# Patient Record
Sex: Female | Born: 1965 | Race: White | Hispanic: No | Marital: Married | Smoking: Never smoker
Health system: Southern US, Community
[De-identification: ages and names within clinical notes are randomized; demographics above are authoritative.]

## PROBLEM LIST (undated history)

## (undated) DIAGNOSIS — E7800 Pure hypercholesterolemia, unspecified: Secondary | ICD-10-CM

## (undated) HISTORY — PX: PARTIAL HYSTERECTOMY: SHX80

## (undated) HISTORY — DX: Pure hypercholesterolemia, unspecified: E78.00

---

## 1997-10-05 ENCOUNTER — Other Ambulatory Visit: Admission: RE | Admit: 1997-10-05 | Discharge: 1997-10-05 | Payer: Self-pay | Admitting: Obstetrics and Gynecology

## 1998-10-17 ENCOUNTER — Other Ambulatory Visit: Admission: RE | Admit: 1998-10-17 | Discharge: 1998-10-17 | Payer: Self-pay | Admitting: Obstetrics and Gynecology

## 1999-04-04 ENCOUNTER — Inpatient Hospital Stay (HOSPITAL_COMMUNITY): Admission: AD | Admit: 1999-04-04 | Discharge: 1999-04-07 | Payer: Self-pay | Admitting: Obstetrics and Gynecology

## 1999-05-08 ENCOUNTER — Other Ambulatory Visit: Admission: RE | Admit: 1999-05-08 | Discharge: 1999-05-08 | Payer: Self-pay | Admitting: Obstetrics and Gynecology

## 1999-05-16 ENCOUNTER — Encounter: Admission: RE | Admit: 1999-05-16 | Discharge: 1999-08-14 | Payer: Self-pay | Admitting: Obstetrics & Gynecology

## 2000-10-03 ENCOUNTER — Other Ambulatory Visit: Admission: RE | Admit: 2000-10-03 | Discharge: 2000-10-03 | Payer: Self-pay | Admitting: *Deleted

## 2001-01-14 ENCOUNTER — Ambulatory Visit (HOSPITAL_BASED_OUTPATIENT_CLINIC_OR_DEPARTMENT_OTHER): Admission: RE | Admit: 2001-01-14 | Discharge: 2001-01-14 | Payer: Self-pay | Admitting: Orthopedic Surgery

## 2001-10-23 ENCOUNTER — Other Ambulatory Visit: Admission: RE | Admit: 2001-10-23 | Discharge: 2001-10-23 | Payer: Self-pay | Admitting: *Deleted

## 2002-12-15 ENCOUNTER — Other Ambulatory Visit: Admission: RE | Admit: 2002-12-15 | Discharge: 2002-12-15 | Payer: Self-pay | Admitting: *Deleted

## 2004-06-05 ENCOUNTER — Other Ambulatory Visit: Admission: RE | Admit: 2004-06-05 | Discharge: 2004-06-05 | Payer: Self-pay | Admitting: Obstetrics & Gynecology

## 2006-09-19 ENCOUNTER — Other Ambulatory Visit: Admission: RE | Admit: 2006-09-19 | Discharge: 2006-09-19 | Payer: Self-pay | Admitting: Family Medicine

## 2006-09-25 ENCOUNTER — Ambulatory Visit (HOSPITAL_COMMUNITY): Admission: AD | Admit: 2006-09-25 | Discharge: 2006-09-25 | Payer: Self-pay | Admitting: Obstetrics & Gynecology

## 2006-09-25 ENCOUNTER — Encounter (INDEPENDENT_AMBULATORY_CARE_PROVIDER_SITE_OTHER): Payer: Self-pay | Admitting: Obstetrics & Gynecology

## 2007-01-08 ENCOUNTER — Encounter (INDEPENDENT_AMBULATORY_CARE_PROVIDER_SITE_OTHER): Payer: Self-pay | Admitting: *Deleted

## 2007-01-08 ENCOUNTER — Ambulatory Visit (HOSPITAL_COMMUNITY): Admission: RE | Admit: 2007-01-08 | Discharge: 2007-01-09 | Payer: Self-pay | Admitting: *Deleted

## 2007-02-14 ENCOUNTER — Encounter: Admission: RE | Admit: 2007-02-14 | Discharge: 2007-02-14 | Payer: Self-pay | Admitting: *Deleted

## 2010-08-29 NOTE — Op Note (Signed)
NAMEKALLI, GREENFIELD             ACCOUNT NO.:  [id number]   MEDICAL RECORD NO.:  [id number]          PATIENT TYPE:  OIB   LOCATION:  9302                          FACILITY:  [facility]   PHYSICIAN:  Mina B. Earlene Plater, M.D.  DATE OF BIRTH:  1965/09/15   DATE OF PROCEDURE:  01/08/2007  DATE OF DISCHARGE:                               OPERATIVE REPORT   PREOPERATIVE DIAGNOSIS:  Menorrhagia.   POSTOPERATIVE DIAGNOSIS:  Menorrhagia.   PROCEDURE:  Total laparoscopic hysterectomy.   SURGEON:  Chester Holstein. Earlene Plater, M.D.   ASSISTANT:  Lenoard Aden, M.D.   ANESTHESIA:  General.   FINDINGS:  Normal-appearing uterus, tubes and ovaries.   SPECIMEN:  Uterus and cervix to pathology.   BLOOD LOSS:  150 mL.   COMPLICATIONS:  None.   INDICATIONS:  Patient with a history of heavy and prolonged menstrual  bleeding.  Previous treatment by another Haja Crego was hysteroscopy, D&C.  Was told she had intracavitary fibroid which was not resectable.  The  patient was counseled regarding alternatives including attempt at  endometrial ablation with either a ThermaChoice or hydrothermal ablation  versus hysterectomy.  Given the intracavitary fibroid she would not be a  good candidate for IUD.  The patient is not responding  to birth control  pills.  Advised of the risks of surgery, including infection, bleeding,  damage to surrounding organs.   PROCEDURE:  The patient is taken to the operating room and general  anesthesia obtained.  She was prepped and draped in standard fashion.  Foley catheter inserted to the bladder, speculum inserted.  Uterus  sounded to 9 cm.  The #8 Rumi was assembled, inserted and secured in  standard manner.   10 mm incision placed in umbilicus.  Fascia was divided sharply and  elevated with Kocher clamps.  Posterior sheath and peritoneum were  entered sharply.  Pursestring suture of 0 Vicryl placed around the  fascial defect.  Hassan cannula inserted and secured.   Pneumoperitoneum  obtained with CO2 gas, 11 mm trocar placed in left lower quadrant, 5 mm  in the right each under direct laparoscopic visualization.   Trendelenburg position obtained.  Bowel mobilized superiorly.  Course of  each ureter identified, found to be well away.  Left round ligament was  placed on traction, sealed and divided with the gyrus dissecting  forceps.  Tube and utero-ovarian pedicle were similarly sealed and  divided.  The left uterine artery was skeletonized.  Bladder flap  created sharply.  Left uterine artery sealed and divided with the gyrus.  Entire procedure repeated on the right side in the same manner.   Bladder was mobilized caudad and the uterus deviated posteriorly and the  Koh cup elevated, anterior colpotomy made with plasma spatula.  Uterus  was then deviated anteriorly and posterior colpotomy made with spatula.  The angles were taken down with the gyrus bipolar with hemostasis  obtained.   Uterus was delivered into the vagina.  This maintained pneumoperitoneum.  The vagina was then closed with interrupted plain sutures of 0 Vicryl  laparoscopically.  Pelvis was irrigated.  Line of dissection inspected,  found to be hemostatic.  Pneumoperitoneum was taken down to minimum  value to visualize the cuff under low pressure.  There was no sign of  bleeding.   The inferior ports were removed.  Their sites inspected laparoscopically  and found to be hemostatic.  Scope was removed.  Gas released.  Hassan  cannula removed.  Umbilical incision elevated.  Pursestring suture  snugged down.  This obliterated the fascial defect.  No intra-abdominal  contents herniated through prior closure.  Skin was closed in umbilicus  and left lower quadrant and the subcutaneous tissue with 4-0 Vicryl.  Each incision was then closed with Dermabond.   The patient tolerated the procedure well without complications.  She was  taken to recovery room awake, alert in stable  condition.  All counts  correct per the operating room staff.      Gerri Spore B. Earlene Plater, M.D.  Electronically Signed     WBD/MEDQ  D:  01/08/2007  T:  01/09/2007  Job:  [id number]

## 2010-08-29 NOTE — Op Note (Signed)
Jasmin Reese, DAMAS NO.:  [id number]   MEDICAL RECORD NO.:  [id number]          PATIENT TYPE:  AMB   LOCATION:  [facility]C                          [facility]ILITY:  [facility]   PHYSICIAN:  Freddy Finner, M.D.   DATE OF BIRTH:  1965-09-08   DATE OF PROCEDURE:  09/25/2006  DATE OF DISCHARGE:                               OPERATIVE REPORT   PREOPERATIVE DIAGNOSIS:  Uterine hemorrhage.   POSTOPERATIVE DIAGNOSIS:  Large submucosal leiomyomata.   OPERATIVE PROCEDURES:  1. Hysteroscopy.  2. Dilation and curettage.  3. Attempted biopsy of myomas; size of myomas precluded hysteroscopic      resection.   ANESTHESIA:  IV sedation, deep paracervical block, as well as  superficial block of the uterosacral ligaments and anterior cervical  lip.   ESTIMATED BLOOD LOSS:  Just during the procedure -- less than 50 mL.   INTRAOPERATIVE COMPLICATIONS:  None.   HISTORY:  The patient is a 45 year old married white female who has had  a history of dysfunctional bleeding and menorrhagia.  In June of 2007  she was found by ultrasound to have uterine leiomyomata, including one  measuring approximately 2.1 cm which was abutting the endometrial canal  on pelvic ultrasound.  Other myomas were smaller measuring only 8 mm and  6 mm.  The 2.1 cm fibroid appeared to have some degeneration; and her  presentation on that day was for breakthrough bleeding on oral  contraceptives which had adequately controlled her menses up to that  point, or at least in the recent past.  She apparently continued to do  well with the oral contraceptives; but presented, again, on the day of  surgery with a history of heavy bleeding on the day prior to the  procedure; and awakening from sleep in a pool of blood with 4 or more  centimeter large clots past vaginally.  She was seen initially by her  general medical doctor with [facility]; and sent, directly,  on my instructions to the hospital.   DESCRIPTION OF  PROCEDURE:  After arrival there she was brought to the  operating room.  She was placed under adequate IV sedation.  She was  placed in the dorsal lithotomy position using the [employer] system.  A Betadine prep of mons, perineum, and vagina was carried out in the  usual fashion and sterile drapes were applied.  Cervix was visualized  using a bivalve speculum.  The anterior cervical lip was anesthetized  with a 50:50 mixture of 1% Xylocaine and 0.5% Marcaine.  A total of  approximately 20 mL of this mixture was used for the local block.  Additional injections were made at 4 and 8 o'clock in the vaginal  fornices at a depth of approximately 5 mm.   The anterior cervical limb was grasped at the anesthetized point with a  single-tooth tenaculum.  Using a spinal needle and approximately 15 to  16 mL of the above described solution, injections were made at  approximately 9 o'clock by sliding along the cervix and the lateral  uterus to a depth of approximately 3 cm.  Aspiration  was performed  repeatedly as the liquid was injected.  No apparent intravenous  injection was made.  This was given approximately 5 minutes to set up.  The cervix was then progressively dilated with Hanks to approximately  23.  This was accomplished easily without resistance to passage of the  dilator's.  The 12-1/2-degree hysteroscope was then used; using 3%  sorbitol as distending medium.  Two large masses could be identified  essentially filling the cavity.  The largest seemed to arise posteriorly  at approximately the 5 o'clock position; and it was estimated to be  approximately 2.5 to 3 cm in diameter.  Another lesion arose from  approximately 11 o'clock at about at the posterior lesion; it measured  approximately 2 cm.   Thorough curettage with repeated efforts to curette the fibroids was  carried out.  Exploration of the uterine cavity was carried out using  the large ring forceps, using Kocher clamps; and it  was impossible to  retrieve either of these lesions.  Their size precluded complete  resection; and sorbitol limits would preclude the loop excisional  procedure; and for that reason, the procedure was terminated at this  point.  Please note that photographs were made of these lesions; and  will be retained in the office record.   The patient was awakened.  She was taken to recovery in good condition.  She was given Methergine IM in the operating room; and 20 units of  Pitocin were added to her IV solution.  Reassessment approximately 1  hour later revealed the patient to be alert.  She was having minimal  bleeding at that point.  She was having only mild cramping.  She was  given instructions about returning for heavy bleeding, again; and for  followup in approximately 1 week in the office.  She was given two  Methergine (0.2 mg tablets) to be taken one at bedtime following the  procedure, and one the next morning.  She was given a prescription for  Vicodin if she felt the need for something stronger for pain.  She was  instructed to avoid vaginal entry.  She was given other routine  postoperative instructions.  Her condition was considered to be  satisfactory; and she was discharged.      Freddy Finner, M.D.  Electronically Signed     WRN/MEDQ  D:  09/26/2006  T:  09/26/2006  Job:  [id number]

## 2010-09-01 NOTE — H&P (Signed)
[name], Reese             ACCOUNT NO.:  [id number]   MEDICAL RECORD NO.:  [id number]          PATIENT TYPE:  AMB   LOCATION:                                [facility]   PHYSICIAN:  Charles A. Delcambre, MDDATE OF BIRTH:  October 20, 1965   DATE OF ADMISSION:  DATE OF DISCHARGE:                              HISTORY & PHYSICAL   She is a 45 year old gravida 1, para 1.  She has previously seen Dr.  Jennette Kettle at [facility], who tried putting her on Loestrin 1 Fe,  which will be a 1/20 pill.  She took this once a day and did not stop  bleeding.  On September 25, 2006, she had many clots passed and went on that  evening to have hysteroscopy and D&C done.  He had talked about an  ablation at this point with NovaSure.  After seeing findings of the  hysteroscopy and D&C, he changed his plan to hysterectomy.  She now  comes in wanting to discuss options.  Ultrasound on October 04, 2005 showed  2.1, 8 mm, and 6 mm intramural fibroids, and a 2.1 cm fibroid abutting  the endometrial stripe, which has also had degeneration.  Left ovary was  normal.  Right ovary was normal.  Uterus measures 6.8 x 3.6 x 4.1 cm.  She continues to take Loestrin 24 Fe since the [facility] and is not currently  bleeding since last Friday.  She comes in on her own for abnormal  uterine bleeding.   PAST MEDICAL HISTORY:  None.   PAST SURGICAL HISTORY:  1. Hand removal of cyst in 2002.  2. D&C and hysteroscopy, as noted above.   MEDICATIONS:  1. Loestrin 24 Fe.  2. Xanax 0.5 mg to help her sleep.  3. Vitamins daily.   ALLERGIES:  No known drug allergies.   SOCIAL HISTORY:  No tobacco, ethanol, drug use, or STD exposure in the  past.  The patient is married and in a monogamous relationship with her  husband.   FAMILY HISTORY:  Hypertension.  Denies family history of breast, uterus,  ovary, cervix, or colon cancer, lymphoma, coronary artery disease,  stroke, or diabetes.   REVIEW OF SYSTEMS:  Denies fever, chills,  rashes, lesions, headaches,  dizziness.  Some seasonal allergies.  No chest pain, no shortness of  breath.  No diarrhea, constipation, or bleeding.  No melena or  hematochezia.  No urgency, frequency, dysuria, incontinence, or  hematuria.  No galactorrhea.  No emotional changes.   PHYSICAL EXAMINATION:  HEENT:  Grossly within normal limits.  NECK:  Supple, without thyromegaly or adenopathy.  LUNGS:  Clear bilaterally.  BACK:  No CVAT.  BREASTS:  Negative, per prior doctor.  Symmetrical.  ABDOMEN:  Soft, nontender.  No hepatosplenomegaly or other masses noted.  PELVIC:  Normal external female genitalia.  Nonenlarged uterus.  Adnexa  nontender.  Ovaries are palpable and normal size bilaterally.  RECTAL:  Not done.  Anus and perineal body appeared normal.   ASSESSMENT:  1. Menorrhagia, 626.2.  2. Fibroids, 218.9.  3. Submucosal fibroid, 218.0.   PLAN:  We  will plan a sonohystogram.  If this is suitable for a  ThermaChoice ablation, we will proceed with ThermaChoice ablation.  She  gives informed consent for ThermaChoice ablation, understanding there is  an approximately 10% failure rate, eumenorrhea 80%-90%, our goal, 30%-  40% risk of amenorrhea.  All questions were answered, and she accepts  risks of perforation, failed procedure, moving on to a rollerball  ablation procedure if necessary with resection of the fibroids as well.  If this were to fail, she would proceed with some type of hysterectomy.  She was in agreement, but she would like to try this ablation.  All  questions were answered.  She gives informed consent and questions re  answered as noted above.  Preoperative serum HCGs and CBC will be given.  All questions were answered.  She will remain n.p.o. for 8 hours prior  to the surgery, and we will proceed as outlined with ThermaChoice  ablation.      Charles A. Sydnee Cabal, MD  Electronically Signed     CAD/MEDQ  D:  10/17/2006  T:  10/18/2006  Job:  [id number]

## 2011-01-25 LAB — CBC
HCT: 32.8 — ABNORMAL LOW
HCT: 41.1
MCHC: 34.3
MCV: 88.1
Platelets: 438 — ABNORMAL HIGH
RBC: 3.72 — ABNORMAL LOW
RBC: 4.73
WBC: 8

## 2011-01-25 LAB — DIFFERENTIAL
Basophils Absolute: 0
Basophils Relative: 0
Eosinophils Absolute: 0.2
Eosinophils Relative: 2
Lymphocytes Relative: 21
Lymphs Abs: 1.7
Monocytes Absolute: 0.3
Monocytes Relative: 3
Neutro Abs: 5.9
Neutrophils Relative %: 73

## 2011-01-25 LAB — PREGNANCY, URINE: Preg Test, Ur: NEGATIVE

## 2011-02-01 LAB — URINE MICROSCOPIC-ADD ON

## 2011-02-01 LAB — URINALYSIS, ROUTINE W REFLEX MICROSCOPIC
Bilirubin Urine: NEGATIVE
Protein, ur: >300 — AB

## 2011-02-01 LAB — CBC
Hemoglobin: 12.7
MCHC: 33.8
RDW: 13.3

## 2014-11-22 ENCOUNTER — Other Ambulatory Visit: Payer: Self-pay | Admitting: Obstetrics & Gynecology

## 2014-11-22 DIAGNOSIS — R928 Other abnormal and inconclusive findings on diagnostic imaging of breast: Secondary | ICD-10-CM

## 2014-12-08 ENCOUNTER — Other Ambulatory Visit: Payer: Self-pay

## 2019-02-03 ENCOUNTER — Other Ambulatory Visit: Payer: Self-pay

## 2019-02-03 ENCOUNTER — Ambulatory Visit
Admission: RE | Admit: 2019-02-03 | Discharge: 2019-02-03 | Disposition: A | Discharge status: Home / Self Care | Payer: 59 | Source: Ambulatory Visit | Attending: Obstetrics & Gynecology | Admitting: Obstetrics & Gynecology

## 2019-02-03 ENCOUNTER — Other Ambulatory Visit: Payer: Self-pay | Admitting: Obstetrics & Gynecology

## 2019-02-03 DIAGNOSIS — Z1231 Encounter for screening mammogram for malignant neoplasm of breast: Secondary | ICD-10-CM

## 2019-07-25 ENCOUNTER — Other Ambulatory Visit: Payer: Self-pay

## 2019-07-25 ENCOUNTER — Encounter: Payer: Self-pay | Admitting: Emergency Medicine

## 2019-07-25 ENCOUNTER — Ambulatory Visit
Admission: EM | Admit: 2019-07-25 | Discharge: 2019-07-25 | Disposition: A | Discharge status: Home / Self Care | Payer: 59 | Attending: Emergency Medicine | Admitting: Emergency Medicine

## 2019-07-25 DIAGNOSIS — B379 Candidiasis, unspecified: Secondary | ICD-10-CM

## 2019-07-25 DIAGNOSIS — R300 Dysuria: Secondary | ICD-10-CM | POA: Diagnosis present

## 2019-07-25 DIAGNOSIS — R350 Frequency of micturition: Secondary | ICD-10-CM | POA: Diagnosis present

## 2019-07-25 LAB — POCT URINALYSIS DIP (MANUAL ENTRY)
Bilirubin, UA: NEGATIVE
Glucose, UA: NEGATIVE mg/dL
Ketones, POC UA: NEGATIVE mg/dL
Nitrite, UA: NEGATIVE
Protein Ur, POC: NEGATIVE mg/dL
Spec Grav, UA: 1.02 (ref 1.010–1.025)
Urobilinogen, UA: 0.2 E.U./dL
pH, UA: 5.5 (ref 5.0–8.0)

## 2019-07-25 MED ORDER — PHENAZOPYRIDINE HCL 100 MG PO TABS: 100.0000 mg | ORAL_TABLET | Freq: Three times a day (TID) | ORAL | 0 refills | Status: DC | PRN

## 2019-07-25 MED ORDER — FLUCONAZOLE 150 MG PO TABS: ORAL_TABLET | ORAL | 0 refills | Status: DC

## 2019-07-25 NOTE — ED Provider Notes (Addendum)
[facility]   CC: Burning with urination  SUBJECTIVE:  Jasmin Reese is a 54 y.o. female who presented to the urgent care for complaint of dysuria, pelvic pressure and feeling like not emptying her bladder for the past 1 day.  Patient denies a precipitating event, recent sexual encounter, excessive caffeine intake.  Denies flank or abdominal pain.  Has not tried any OTC medication.  Symptoms are made worse with urination.  Report frequent urination and urgency.  Admits to similar symptoms in the past.  Denies fever, chills, nausea, vomiting, abdominal pain, flank pain, abnormal vaginal discharge or bleeding, hematuria.    She also states she would like to get Diflucan prescribed for possible yeast infection.  LMP: No LMP recorded. Patient is postmenopausal.  ROS: As in HPI.  All other pertinent ROS negative.     History reviewed. No pertinent past medical history. History reviewed. No pertinent surgical history. No Known Allergies No current facility-administered medications on file prior to encounter.   No current outpatient medications on file prior to encounter.   Social History   Socioeconomic History  . Marital status: Married    Spouse name: Not on file  . Number of children: Not on file  . Years of education: Not on file  . Highest education level: Not on file  Occupational History  . Not on file  Tobacco Use  . Smoking status: Never Smoker  . Smokeless tobacco: Never Used  Substance and Sexual Activity  . Alcohol use: Yes    Comment: occasionally  . Drug use: Never  . Sexual activity: Yes  Other Topics Concern  . Not on file  Social History Narrative  . Not on file   Social Determinants of Health   Financial Resource Strain:   . Difficulty of Paying Living Expenses:   Food Insecurity:   . Worried About Programme researcher, broadcasting/film/video in the Last Year:   . Barista in the Last Year:   Transportation Needs:   . Freight forwarder  (Medical):   Marland Kitchen Lack of Transportation (Non-Medical):   Physical Activity:   . Days of Exercise per Week:   . Minutes of Exercise per Session:   Stress:   . Feeling of Stress :   Social Connections:   . Frequency of Communication with Friends and Family:   . Frequency of Social Gatherings with Friends and Family:   . Attends Religious Services:   . Active Member of Clubs or Organizations:   . Attends Banker Meetings:   Marland Kitchen Marital Status:   Intimate Partner Violence:   . Fear of Current or Ex-Partner:   . Emotionally Abused:   Marland Kitchen Physically Abused:   . Sexually Abused:    Family History  Problem Relation Age of Onset  . Hypertension Mother     OBJECTIVE:  There were no vitals filed for this visit. General appearance: AOx3 in no acute distress HEENT: NCAT.  Oropharynx clear.  Lungs: clear to auscultation bilaterally without adventitious breath sounds Heart: regular rate and rhythm.  Radial pulses 2+ symmetrical bilaterally Abdomen: soft; non-distended; no tenderness; bowel sounds present; no guarding or rebound tenderness Back: no CVA tenderness Extremities: no edema; symmetrical with no gross deformities Skin: warm and dry Neurologic: Ambulates from chair to exam table without difficulty Psychological: alert and cooperative; normal mood and affect  Labs Reviewed  POCT URINALYSIS DIP (MANUAL ENTRY) - Abnormal; Notable for the following components:      Result  Value   Blood, UA small (*)    Leukocytes, UA Trace (*)    All other components within normal limits  URINE CULTURE    ASSESSMENT & PLAN:  1. Frequent urination   2. Dysuria    POCT urine analysis showed trace of blood with trace of leukocyte, inconclusive for UTI.  Sample will be culture.  Patient will call if results are abnormal. Pyridium will be prescribed for symptomatic relief. Diflucan was refilled for possible yeast infection  Meds ordered this encounter  Medications  . phenazopyridine  (PYRIDIUM) 100 MG tablet    Sig: Take 1 tablet (100 mg total) by mouth 3 (three) times daily as needed for pain.    Dispense:  20 tablet    Refill:  0  . fluconazole (DIFLUCAN) 150 MG tablet    Sig: Take 1 tablet ( 150 mg ) by mouth daily.  May take second dose 3 days after the first dose if symptom does not resolve.    Dispense:  3 tablet    Refill:  0   Discharge instruction Urine culture sent.  We will call you with the results.   Push fluids and get plenty of rest.   Take medication as directed and to completion Take pyridium as prescribed and as needed for symptomatic relief Follow up with PCP if symptoms persists Return here or go to ER if you have any new or worsening symptoms such as fever, worsening abdominal pain, nausea/vomiting, flank pain, etc...  Outlined signs and symptoms indicating need for more acute intervention. Patient verbalized understanding. After Visit Summary given.     Emerson Monte, FNP 07/25/19 1124    Emerson Monte, FNP 07/25/19 1125

## 2019-07-25 NOTE — Discharge Instructions (Addendum)
Urine culture sent.  We will call you with the results.   Push fluids and get plenty of rest.   Take medication as directed and to completion Take pyridium as prescribed and as needed for symptomatic relief Follow up with PCP if symptoms persists Return here or go to ER if you have any new or worsening symptoms such as fever, worsening abdominal pain, nausea/vomiting, flank pain, etc...  Outlined signs and symptoms indicating need for more acute intervention. Patient verbalized understanding. After Visit Summary given.

## 2019-07-25 NOTE — ED Triage Notes (Signed)
Last night started feeling like she was not emptying her bladder completely.  Has some lower back pressure and pelvic pressure. Urinated small amount this am, feels like she should have had more urine this am.

## 2019-07-27 LAB — URINE CULTURE: Culture: NO GROWTH

## 2020-09-21 IMAGING — MG DIGITAL SCREENING BILAT W/ TOMO W/ CAD
8 series · 8 of 24 positions shown · non-contrast
Comparison: Previous exam(s).

CLINICAL DATA: Screening.

EXAM:
DIGITAL SCREENING BILATERAL MAMMOGRAM WITH TOMO AND CAD

[R MLO synth-2D]
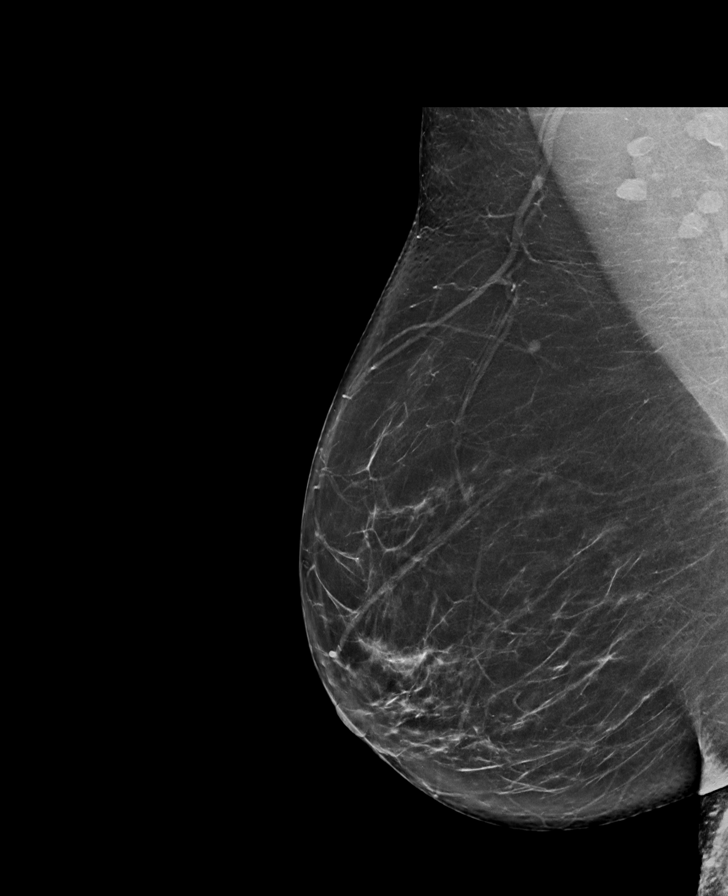

[L CC synth-2D]
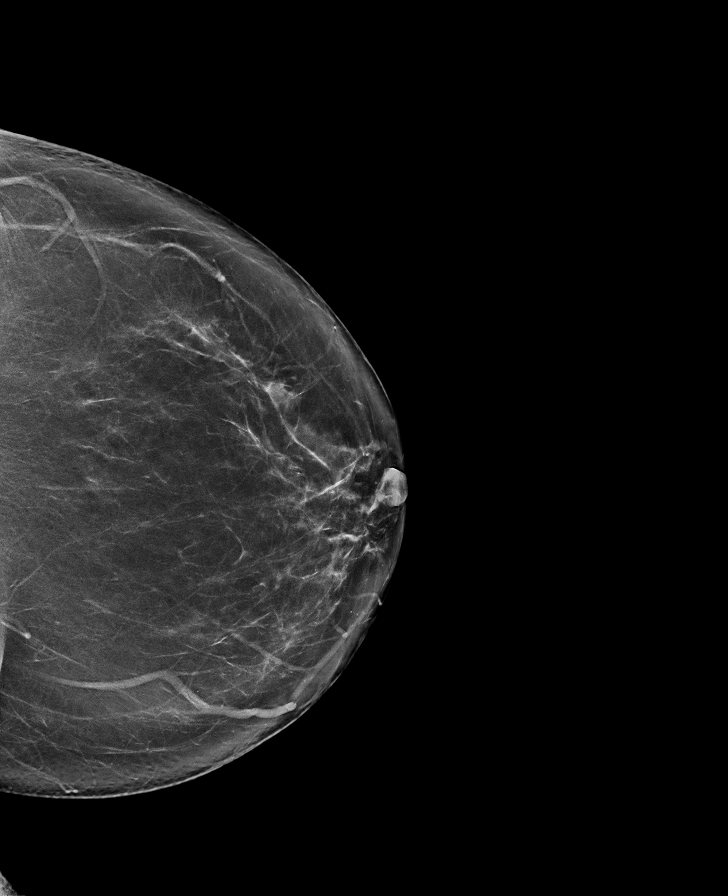

[R CC synth-2D]
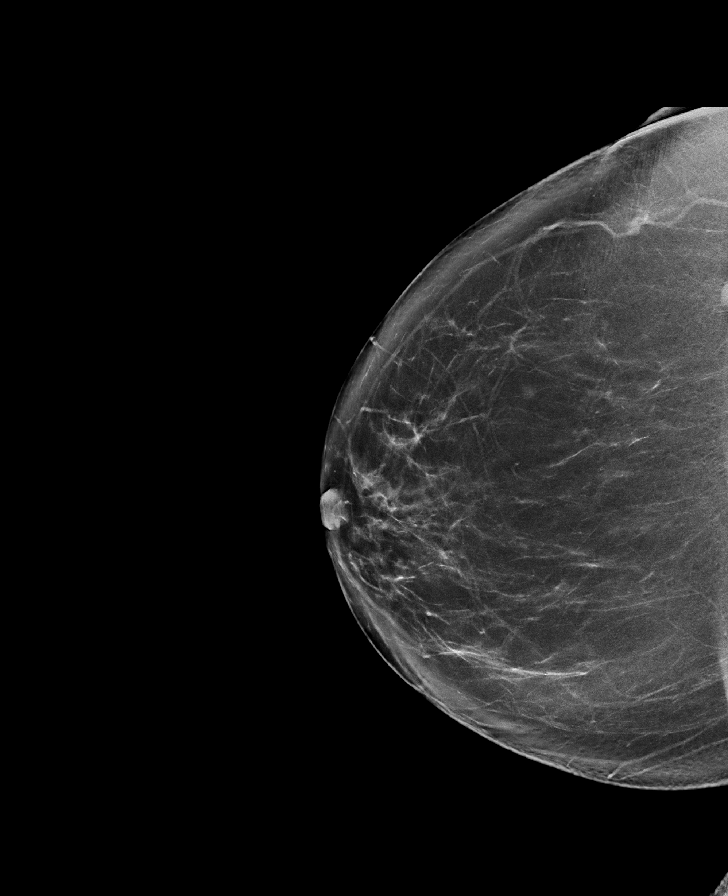

[L MLO synth-2D]
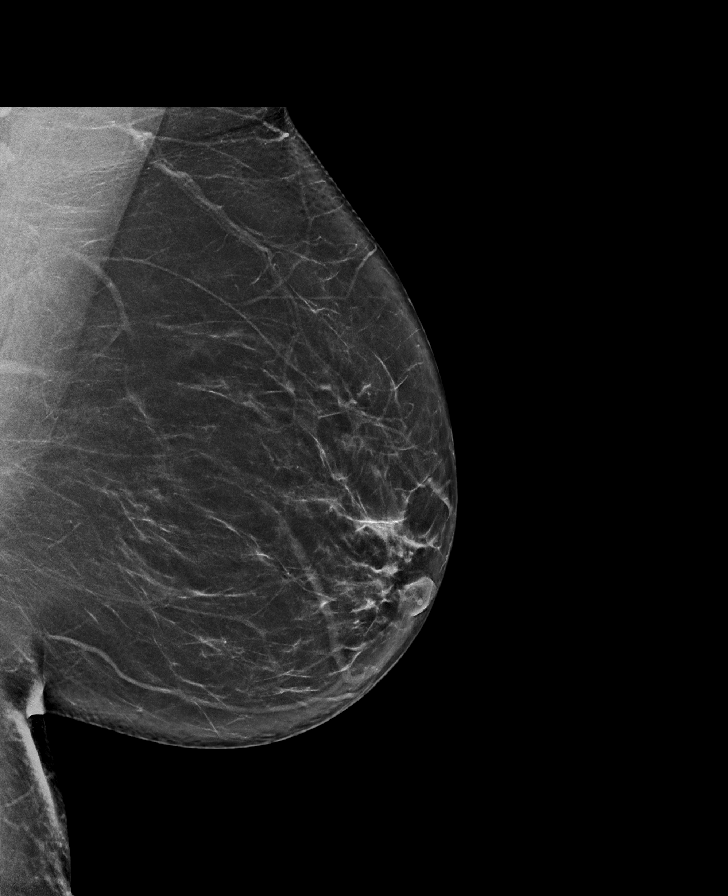

[R MLO tomo · tomo slice 43/85.0]
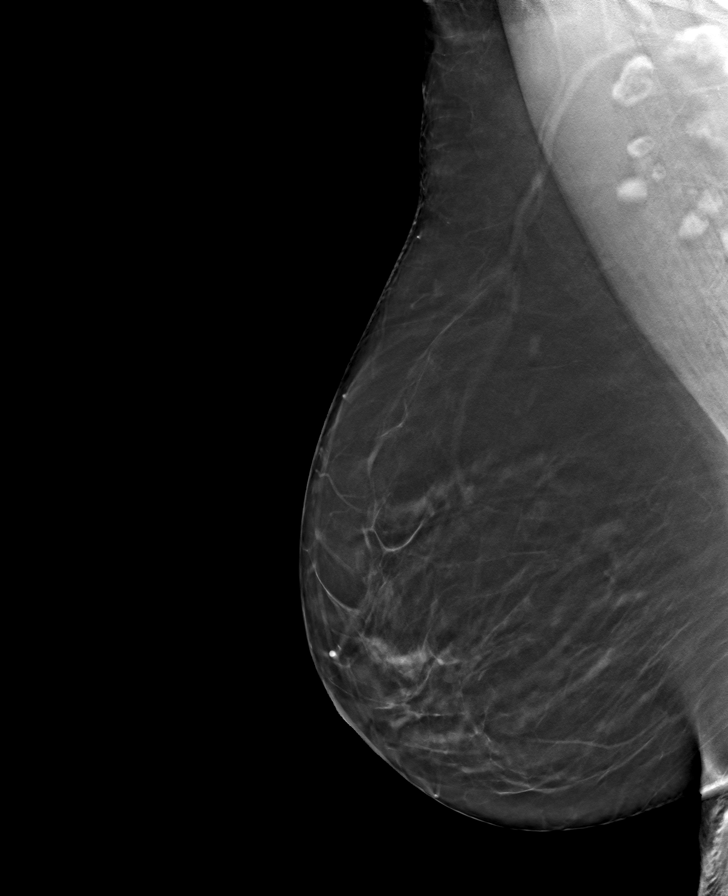

[L MLO tomo · tomo slice 40/79.0]
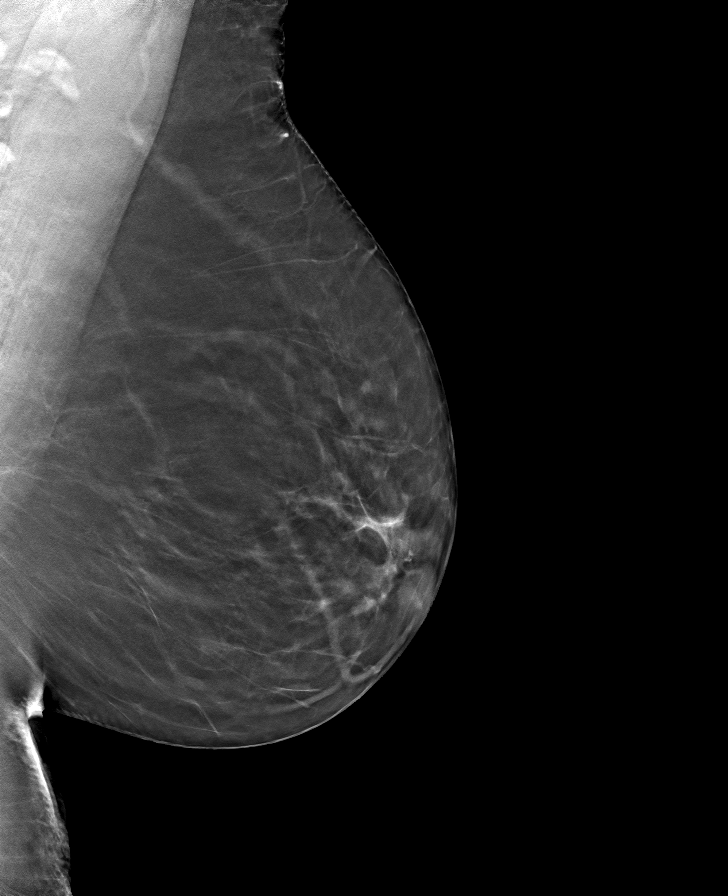

[R CC tomo · tomo slice 43/86.0]
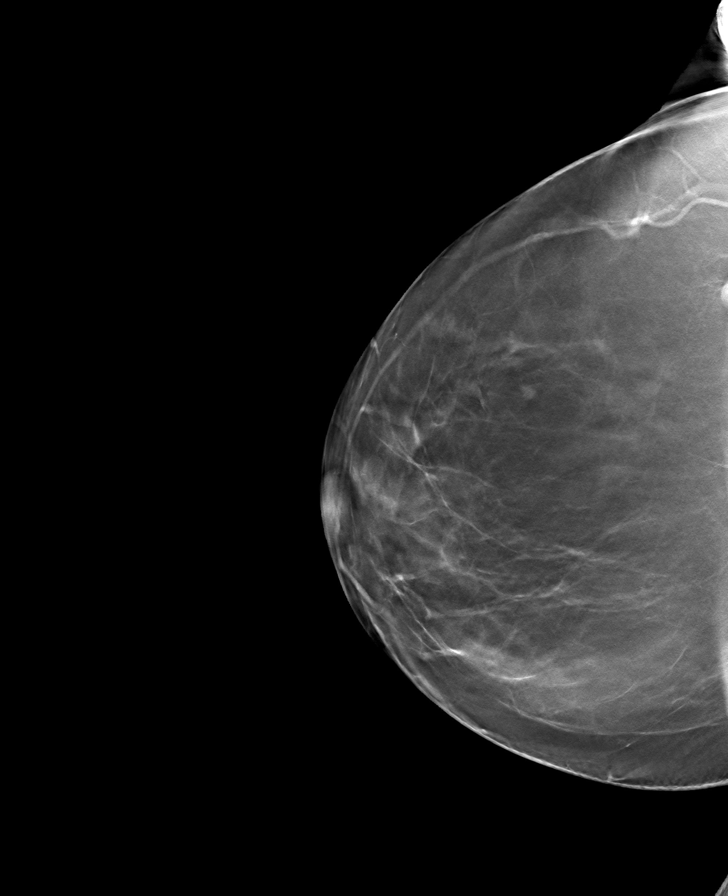

[L CC tomo · tomo slice 41/81.0]
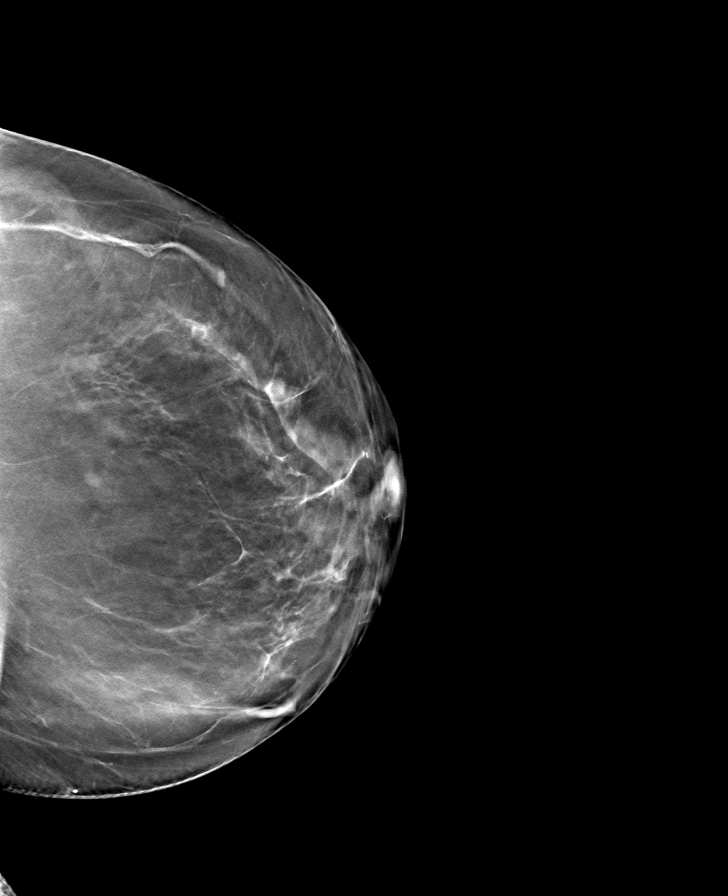

[8 of 24 positions shown; findings below may reference images not displayed]

ACR Breast Density Category b: There are scattered areas of
fibroglandular density.
FINDINGS: There are no findings suspicious for malignancy. Images were
processed with CAD.
IMPRESSION: No mammographic evidence of malignancy. A result letter of this
screening mammogram will be mailed directly to the patient.

RECOMMENDATION:
Screening mammogram in one year. (Code:CN-U-775)

BI-RADS CATEGORY  1: Negative.

## 2021-02-10 ENCOUNTER — Encounter: Payer: Self-pay | Admitting: Internal Medicine

## 2021-03-07 ENCOUNTER — Encounter: Payer: Self-pay | Admitting: Internal Medicine

## 2021-03-07 ENCOUNTER — Ambulatory Visit (INDEPENDENT_AMBULATORY_CARE_PROVIDER_SITE_OTHER): Payer: 59 | Admitting: Internal Medicine

## 2021-03-07 ENCOUNTER — Other Ambulatory Visit (INDEPENDENT_AMBULATORY_CARE_PROVIDER_SITE_OTHER): Payer: 59

## 2021-03-07 VITALS — BP 110/80 | HR 64 | Ht 69.0 in | Wt 216.0 lb

## 2021-03-07 DIAGNOSIS — Z1212 Encounter for screening for malignant neoplasm of rectum: Secondary | ICD-10-CM | POA: Diagnosis not present

## 2021-03-07 DIAGNOSIS — R1310 Dysphagia, unspecified: Secondary | ICD-10-CM | POA: Diagnosis not present

## 2021-03-07 DIAGNOSIS — Z1211 Encounter for screening for malignant neoplasm of colon: Secondary | ICD-10-CM | POA: Diagnosis not present

## 2021-03-07 LAB — CBC WITH DIFFERENTIAL/PLATELET
Basophils Absolute: 0.1 10*3/uL (ref 0.0–0.1)
Basophils Relative: 1 % (ref 0.0–3.0)
Eosinophils Absolute: 0.2 10*3/uL (ref 0.0–0.7)
Eosinophils Relative: 2.9 % (ref 0.0–5.0)
HCT: 46.4 % — ABNORMAL HIGH (ref 36.0–46.0)
Hemoglobin: 15.5 g/dL — ABNORMAL HIGH (ref 12.0–15.0)
Lymphocytes Relative: 28.3 % (ref 12.0–46.0)
Lymphs Abs: 1.7 10*3/uL (ref 0.7–4.0)
MCHC: 33.4 g/dL (ref 30.0–36.0)
MCV: 89.4 fl (ref 78.0–100.0)
Monocytes Absolute: 0.4 10*3/uL (ref 0.1–1.0)
Monocytes Relative: 6.3 % (ref 3.0–12.0)
Neutro Abs: 3.6 10*3/uL (ref 1.4–7.7)
Neutrophils Relative %: 61.5 % (ref 43.0–77.0)
Platelets: 361 10*3/uL (ref 150.0–400.0)
RBC: 5.19 Mil/uL — ABNORMAL HIGH (ref 3.87–5.11)
RDW: 13.2 % (ref 11.5–15.5)
WBC: 5.9 10*3/uL (ref 4.0–10.5)

## 2021-03-07 LAB — COMPREHENSIVE METABOLIC PANEL
ALT: 25 U/L (ref 0–35)
AST: 20 U/L (ref 0–37)
Albumin: 4.4 g/dL (ref 3.5–5.2)
Alkaline Phosphatase: 65 U/L (ref 39–117)
BUN: 9 mg/dL (ref 6–23)
CO2: 28 mEq/L (ref 19–32)
Calcium: 9.3 mg/dL (ref 8.4–10.5)
Chloride: 101 mEq/L (ref 96–112)
Creatinine, Ser: 0.87 mg/dL (ref 0.40–1.20)
GFR: 75.58 mL/min (ref >60.00)
Glucose, Bld: 86 mg/dL (ref 70–99)
Potassium: 4.4 mEq/L (ref 3.5–5.1)
Sodium: 137 mEq/L (ref 135–145)
Total Bilirubin: 0.5 mg/dL (ref 0.2–1.2)
Total Protein: 7.7 g/dL (ref 6.0–8.3)

## 2021-03-07 NOTE — Progress Notes (Signed)
Chief Complaint: Dysphagia, colon cancer screening  HPI : 55 year old female with history of HLD presents with dysphagia and colon cancer screening  Endorses dysphagia that started when she was living in [place] 3-5 years ago. She moved to [place] 3 years ago. Twice a year on average the food gets stuck in the bottom of her chest. One time she was eating sushi and another time she was eating hot pot. The dysphagia occurs frequently with rice. When the dysphagia episode occurs, she would have to wait for the food to eventually to go down. Only has had dysphagia to solids, not liquids. The dysphagia has been stable in severity. Denies N&V. Denies chest burning or regurgitation. Denies fam hx of GI issues. She has gained about 20 lbs since menopause kicked in. Denies abdominal pain, diarrhea, constipation, melena, hematochezia. She had an EGD done in [place] about 2012 that showed small ulcers, but she cannot recall who performed that procedure. She was told to take Prilosec after that procedure but never followed through because she attributed her ulcers to stress. Patient has never had a colonoscopy in the past. She has been told to turn in stool cards but has always forgotten to return them. She has had a partial hysterectomy in the past.   Past Medical History:  Diagnosis Date   High cholesterol      Past Surgical History:  Procedure Laterality Date   PARTIAL HYSTERECTOMY     Family History  Problem Relation Age of Onset   Hypertension Mother    Colon cancer Neg Hx    Stomach cancer Neg Hx    Esophageal cancer Neg Hx    Social History   Tobacco Use   Smoking status: Never   Smokeless tobacco: Never  Vaping Use   Vaping Use: Never used  Substance Use Topics   Alcohol use: Yes    Comment: occasionally   Drug use: Never   Current Outpatient Medications  Medication Sig Dispense Refill   fluconazole (DIFLUCAN) 150 MG tablet Take 1 tablet ( 150 mg ) by mouth daily.  May take second  dose 3 days after the first dose if symptom does not resolve. 3 tablet 0   No current facility-administered medications for this visit.   No Known Allergies   Review of Systems: All systems reviewed and negative except where noted in HPI.   Physical Exam: BP 110/80   Pulse 64   Ht 5\' 9"  (1.753 m)   Wt 216 lb (98 kg)   BMI 31.90 kg/m  Constitutional: Pleasant,well-developed, female in no acute distress. HEENT: Normocephalic and atraumatic. Conjunctivae are normal. No scleral icterus. Cardiovascular: Normal rate, regular rhythm.  Pulmonary/chest: Effort normal and breath sounds normal. No wheezing, rales or rhonchi. Abdominal: Soft, nondistended, nontender. Bowel sounds active throughout. There are no masses palpable. No hepatomegaly. Extremities: No edema Neurological: Alert and oriented to person place and time. Skin: Skin is warm and dry. No rashes noted. Psychiatric: Normal mood and affect. Behavior is normal.  Last set of labs in our system is from 2008  ASSESSMENT AND PLAN: Dysphagia Colon cancer screening Patient presents with intermittent dysphagia to solids, suggestive of an anatomic cause. We will plan to start off with an EGD for further evaluation. Patient is also due for colon cancer screening so will plan to add on colonoscopy at the same time. Since patient has not had labs done in our system since 2008, we will plan to get some basic labs today. -  Check CBC and CMP. Both were unremarkable. - Check EGD/colonoscopy [facility]  Eulah Pont, MD

## 2021-03-07 NOTE — Patient Instructions (Signed)
If you are age 54 or older, your body mass index should be between 23-30. Your Body mass index is 31.9 kg/m. If this is out of the aforementioned range listed, please consider follow up with your Primary Care Provider.  If you are age 73 or younger, your body mass index should be between 19-25. Your Body mass index is 31.9 kg/m. If this is out of the aformentioned range listed, please consider follow up with your Primary Care Provider.   Your provider has requested that you go to the basement level for lab work before leaving today. Press "B" on the elevator. The lab is located at the first door on the left as you exit the elevator.  You have been scheduled for an endoscopy and colonoscopy. Please follow the written instructions given to you at your visit today. Please pick up your prep supplies at the pharmacy within the next 1-3 days. If you use inhalers (even only as needed), please bring them with you on the day of your procedure.    The [facility] providers would like to encourage you to use [facility] to communicate with providers for non-urgent requests or questions.  Due to long hold times on the telephone, sending your provider a message by [facility] may be a faster and more efficient way to get a response.  Please allow 48 business hours for a response.  Please remember that this is for non-urgent requests.   Due to recent changes in healthcare laws, you may see the results of your imaging and laboratory studies on MyChart before your provider has had a chance to review them.  We understand that in some cases there may be results that are confusing or concerning to you. Not all laboratory results come back in the same time frame and the provider may be waiting for multiple results in order to interpret others.  Please give [place] 48 hours in order for your provider to thoroughly review all the results before contacting the office for clarification of your results.   It was a pleasure to see you  today!  Thank you for trusting me with your gastrointestinal care!    Norwood Levo, MD

## 2021-04-14 ENCOUNTER — Encounter: Payer: 59 | Admitting: Internal Medicine

## 2021-05-18 DIAGNOSIS — R5382 Chronic fatigue, unspecified: Secondary | ICD-10-CM | POA: Diagnosis not present

## 2021-05-18 DIAGNOSIS — N951 Menopausal and female climacteric states: Secondary | ICD-10-CM | POA: Diagnosis not present

## 2021-05-25 DIAGNOSIS — R6882 Decreased libido: Secondary | ICD-10-CM | POA: Diagnosis not present

## 2021-05-25 DIAGNOSIS — Z6832 Body mass index (BMI) 32.0-32.9, adult: Secondary | ICD-10-CM | POA: Diagnosis not present

## 2021-05-25 DIAGNOSIS — N951 Menopausal and female climacteric states: Secondary | ICD-10-CM | POA: Diagnosis not present

## 2021-05-25 DIAGNOSIS — R232 Flushing: Secondary | ICD-10-CM | POA: Diagnosis not present

## 2021-05-25 DIAGNOSIS — B351 Tinea unguium: Secondary | ICD-10-CM | POA: Diagnosis not present

## 2021-11-29 DIAGNOSIS — N951 Menopausal and female climacteric states: Secondary | ICD-10-CM | POA: Diagnosis not present

## 2021-11-29 DIAGNOSIS — R5382 Chronic fatigue, unspecified: Secondary | ICD-10-CM | POA: Diagnosis not present

## 2021-12-01 DIAGNOSIS — R6882 Decreased libido: Secondary | ICD-10-CM | POA: Diagnosis not present

## 2021-12-01 DIAGNOSIS — Z6831 Body mass index (BMI) 31.0-31.9, adult: Secondary | ICD-10-CM | POA: Diagnosis not present

## 2021-12-01 DIAGNOSIS — G4700 Insomnia, unspecified: Secondary | ICD-10-CM | POA: Diagnosis not present

## 2021-12-01 DIAGNOSIS — N951 Menopausal and female climacteric states: Secondary | ICD-10-CM | POA: Diagnosis not present

## 2022-01-31 DIAGNOSIS — Z6832 Body mass index (BMI) 32.0-32.9, adult: Secondary | ICD-10-CM | POA: Diagnosis not present

## 2022-01-31 DIAGNOSIS — M8580 Other specified disorders of bone density and structure, unspecified site: Secondary | ICD-10-CM | POA: Insufficient documentation

## 2022-01-31 DIAGNOSIS — Z01419 Encounter for gynecological examination (general) (routine) without abnormal findings: Secondary | ICD-10-CM | POA: Diagnosis not present

## 2022-01-31 DIAGNOSIS — Z1231 Encounter for screening mammogram for malignant neoplasm of breast: Secondary | ICD-10-CM | POA: Diagnosis not present

## 2022-02-05 ENCOUNTER — Other Ambulatory Visit: Payer: Self-pay | Admitting: Obstetrics and Gynecology

## 2022-02-05 DIAGNOSIS — R928 Other abnormal and inconclusive findings on diagnostic imaging of breast: Secondary | ICD-10-CM

## 2022-02-13 ENCOUNTER — Other Ambulatory Visit: Payer: 59

## 2022-02-21 ENCOUNTER — Ambulatory Visit
Admission: RE | Admit: 2022-02-21 | Discharge: 2022-02-21 | Disposition: A | Discharge status: Home / Self Care | Payer: BC Managed Care – PPO | Source: Ambulatory Visit | Attending: Obstetrics and Gynecology | Admitting: Obstetrics and Gynecology

## 2022-02-21 ENCOUNTER — Ambulatory Visit
Admission: RE | Admit: 2022-02-21 | Discharge: 2022-02-21 | Disposition: A | Discharge status: Home / Self Care | Payer: 59 | Source: Ambulatory Visit | Attending: Obstetrics and Gynecology | Admitting: Obstetrics and Gynecology

## 2022-02-21 ENCOUNTER — Other Ambulatory Visit: Payer: Self-pay | Admitting: Obstetrics and Gynecology

## 2022-02-21 DIAGNOSIS — R928 Other abnormal and inconclusive findings on diagnostic imaging of breast: Secondary | ICD-10-CM

## 2022-02-21 DIAGNOSIS — N6314 Unspecified lump in the right breast, lower inner quadrant: Secondary | ICD-10-CM | POA: Diagnosis not present

## 2022-02-21 DIAGNOSIS — N6312 Unspecified lump in the right breast, upper inner quadrant: Secondary | ICD-10-CM | POA: Diagnosis not present

## 2022-02-21 DIAGNOSIS — R92321 Mammographic fibroglandular density, right breast: Secondary | ICD-10-CM | POA: Diagnosis not present

## 2022-05-03 DIAGNOSIS — L723 Sebaceous cyst: Secondary | ICD-10-CM | POA: Diagnosis not present

## 2022-08-06 DIAGNOSIS — Z23 Encounter for immunization: Secondary | ICD-10-CM | POA: Diagnosis not present

## 2022-08-06 DIAGNOSIS — L259 Unspecified contact dermatitis, unspecified cause: Secondary | ICD-10-CM | POA: Diagnosis not present

## 2022-08-23 ENCOUNTER — Ambulatory Visit
Admission: RE | Admit: 2022-08-23 | Discharge: 2022-08-23 | Disposition: A | Discharge status: Home / Self Care | Payer: BC Managed Care – PPO | Source: Ambulatory Visit | Attending: Obstetrics and Gynecology | Admitting: Obstetrics and Gynecology

## 2022-08-23 DIAGNOSIS — N63 Unspecified lump in unspecified breast: Secondary | ICD-10-CM | POA: Diagnosis not present

## 2022-08-23 DIAGNOSIS — R928 Other abnormal and inconclusive findings on diagnostic imaging of breast: Secondary | ICD-10-CM

## 2022-08-23 DIAGNOSIS — N6315 Unspecified lump in the right breast, overlapping quadrants: Secondary | ICD-10-CM | POA: Diagnosis not present

## 2022-09-12 ENCOUNTER — Other Ambulatory Visit: Payer: Self-pay | Admitting: Obstetrics and Gynecology

## 2022-09-12 DIAGNOSIS — N6310 Unspecified lump in the right breast, unspecified quadrant: Secondary | ICD-10-CM

## 2022-10-31 DIAGNOSIS — R319 Hematuria, unspecified: Secondary | ICD-10-CM | POA: Diagnosis not present

## 2022-10-31 DIAGNOSIS — R932 Abnormal findings on diagnostic imaging of liver and biliary tract: Secondary | ICD-10-CM | POA: Diagnosis not present

## 2022-10-31 DIAGNOSIS — M25551 Pain in right hip: Secondary | ICD-10-CM | POA: Diagnosis not present

## 2022-10-31 DIAGNOSIS — N390 Urinary tract infection, site not specified: Secondary | ICD-10-CM | POA: Diagnosis not present

## 2022-10-31 DIAGNOSIS — K760 Fatty (change of) liver, not elsewhere classified: Secondary | ICD-10-CM | POA: Diagnosis not present

## 2022-10-31 DIAGNOSIS — M5431 Sciatica, right side: Secondary | ICD-10-CM | POA: Diagnosis not present

## 2022-10-31 DIAGNOSIS — I700 Atherosclerosis of aorta: Secondary | ICD-10-CM | POA: Diagnosis not present

## 2022-11-06 DIAGNOSIS — M5431 Sciatica, right side: Secondary | ICD-10-CM | POA: Diagnosis not present

## 2022-11-07 DIAGNOSIS — M47816 Spondylosis without myelopathy or radiculopathy, lumbar region: Secondary | ICD-10-CM | POA: Diagnosis not present

## 2022-11-07 DIAGNOSIS — M5136 Other intervertebral disc degeneration, lumbar region: Secondary | ICD-10-CM | POA: Diagnosis not present

## 2022-11-07 DIAGNOSIS — M4727 Other spondylosis with radiculopathy, lumbosacral region: Secondary | ICD-10-CM | POA: Diagnosis not present

## 2022-11-07 DIAGNOSIS — M5186 Other intervertebral disc disorders, lumbar region: Secondary | ICD-10-CM | POA: Diagnosis not present

## 2022-11-07 DIAGNOSIS — M48061 Spinal stenosis, lumbar region without neurogenic claudication: Secondary | ICD-10-CM | POA: Diagnosis not present

## 2022-11-07 DIAGNOSIS — M5116 Intervertebral disc disorders with radiculopathy, lumbar region: Secondary | ICD-10-CM | POA: Diagnosis not present

## 2022-11-07 DIAGNOSIS — M4726 Other spondylosis with radiculopathy, lumbar region: Secondary | ICD-10-CM | POA: Diagnosis not present

## 2022-11-20 DIAGNOSIS — M48061 Spinal stenosis, lumbar region without neurogenic claudication: Secondary | ICD-10-CM | POA: Diagnosis not present

## 2022-11-20 DIAGNOSIS — M5136 Other intervertebral disc degeneration, lumbar region: Secondary | ICD-10-CM | POA: Diagnosis not present

## 2022-11-21 DIAGNOSIS — M6281 Muscle weakness (generalized): Secondary | ICD-10-CM | POA: Diagnosis not present

## 2022-11-21 DIAGNOSIS — M5136 Other intervertebral disc degeneration, lumbar region: Secondary | ICD-10-CM | POA: Diagnosis not present

## 2022-11-21 DIAGNOSIS — M48061 Spinal stenosis, lumbar region without neurogenic claudication: Secondary | ICD-10-CM | POA: Diagnosis not present

## 2022-11-21 DIAGNOSIS — M5386 Other specified dorsopathies, lumbar region: Secondary | ICD-10-CM | POA: Diagnosis not present

## 2022-11-26 DIAGNOSIS — M6281 Muscle weakness (generalized): Secondary | ICD-10-CM | POA: Diagnosis not present

## 2022-11-26 DIAGNOSIS — M5386 Other specified dorsopathies, lumbar region: Secondary | ICD-10-CM | POA: Diagnosis not present

## 2022-11-26 DIAGNOSIS — M48061 Spinal stenosis, lumbar region without neurogenic claudication: Secondary | ICD-10-CM | POA: Diagnosis not present

## 2022-11-26 DIAGNOSIS — M5136 Other intervertebral disc degeneration, lumbar region: Secondary | ICD-10-CM | POA: Diagnosis not present

## 2022-11-27 DIAGNOSIS — D120 Benign neoplasm of cecum: Secondary | ICD-10-CM | POA: Diagnosis not present

## 2022-11-27 DIAGNOSIS — K648 Other hemorrhoids: Secondary | ICD-10-CM | POA: Diagnosis not present

## 2022-11-27 DIAGNOSIS — D125 Benign neoplasm of sigmoid colon: Secondary | ICD-10-CM | POA: Diagnosis not present

## 2022-11-27 DIAGNOSIS — Z1211 Encounter for screening for malignant neoplasm of colon: Secondary | ICD-10-CM | POA: Diagnosis not present

## 2022-11-27 DIAGNOSIS — K2930 Chronic superficial gastritis without bleeding: Secondary | ICD-10-CM | POA: Diagnosis not present

## 2022-11-27 DIAGNOSIS — R1310 Dysphagia, unspecified: Secondary | ICD-10-CM | POA: Diagnosis not present

## 2022-11-27 DIAGNOSIS — K3189 Other diseases of stomach and duodenum: Secondary | ICD-10-CM | POA: Diagnosis not present

## 2022-11-27 DIAGNOSIS — K222 Esophageal obstruction: Secondary | ICD-10-CM | POA: Diagnosis not present

## 2022-11-27 DIAGNOSIS — K2289 Other specified disease of esophagus: Secondary | ICD-10-CM | POA: Diagnosis not present

## 2022-11-27 DIAGNOSIS — K5730 Diverticulosis of large intestine without perforation or abscess without bleeding: Secondary | ICD-10-CM | POA: Diagnosis not present

## 2022-12-06 DIAGNOSIS — R300 Dysuria: Secondary | ICD-10-CM | POA: Diagnosis not present

## 2022-12-06 DIAGNOSIS — R3129 Other microscopic hematuria: Secondary | ICD-10-CM | POA: Diagnosis not present

## 2022-12-06 DIAGNOSIS — R197 Diarrhea, unspecified: Secondary | ICD-10-CM | POA: Diagnosis not present

## 2022-12-07 DIAGNOSIS — M5386 Other specified dorsopathies, lumbar region: Secondary | ICD-10-CM | POA: Diagnosis not present

## 2022-12-07 DIAGNOSIS — M48061 Spinal stenosis, lumbar region without neurogenic claudication: Secondary | ICD-10-CM | POA: Diagnosis not present

## 2022-12-07 DIAGNOSIS — M5136 Other intervertebral disc degeneration, lumbar region: Secondary | ICD-10-CM | POA: Diagnosis not present

## 2022-12-07 DIAGNOSIS — M6281 Muscle weakness (generalized): Secondary | ICD-10-CM | POA: Diagnosis not present

## 2022-12-11 DIAGNOSIS — M25651 Stiffness of right hip, not elsewhere classified: Secondary | ICD-10-CM | POA: Diagnosis not present

## 2022-12-11 DIAGNOSIS — M25551 Pain in right hip: Secondary | ICD-10-CM | POA: Diagnosis not present

## 2022-12-11 DIAGNOSIS — M545 Low back pain, unspecified: Secondary | ICD-10-CM | POA: Diagnosis not present

## 2022-12-24 DIAGNOSIS — M545 Low back pain, unspecified: Secondary | ICD-10-CM | POA: Diagnosis not present

## 2022-12-24 DIAGNOSIS — M25651 Stiffness of right hip, not elsewhere classified: Secondary | ICD-10-CM | POA: Diagnosis not present

## 2022-12-24 DIAGNOSIS — M25551 Pain in right hip: Secondary | ICD-10-CM | POA: Diagnosis not present

## 2023-01-03 DIAGNOSIS — Z90710 Acquired absence of both cervix and uterus: Secondary | ICD-10-CM | POA: Diagnosis not present

## 2023-01-03 DIAGNOSIS — Z124 Encounter for screening for malignant neoplasm of cervix: Secondary | ICD-10-CM | POA: Diagnosis not present

## 2023-01-03 DIAGNOSIS — Z0000 Encounter for general adult medical examination without abnormal findings: Secondary | ICD-10-CM | POA: Diagnosis not present

## 2023-01-03 DIAGNOSIS — Z1211 Encounter for screening for malignant neoplasm of colon: Secondary | ICD-10-CM | POA: Diagnosis not present

## 2023-01-03 DIAGNOSIS — Z0001 Encounter for general adult medical examination with abnormal findings: Secondary | ICD-10-CM | POA: Diagnosis not present

## 2023-01-03 DIAGNOSIS — E7800 Pure hypercholesterolemia, unspecified: Secondary | ICD-10-CM | POA: Diagnosis not present

## 2023-01-03 DIAGNOSIS — E785 Hyperlipidemia, unspecified: Secondary | ICD-10-CM | POA: Diagnosis not present

## 2023-01-03 DIAGNOSIS — E663 Overweight: Secondary | ICD-10-CM | POA: Diagnosis not present

## 2023-01-03 DIAGNOSIS — Z1231 Encounter for screening mammogram for malignant neoplasm of breast: Secondary | ICD-10-CM | POA: Diagnosis not present

## 2023-01-16 DIAGNOSIS — Z6831 Body mass index (BMI) 31.0-31.9, adult: Secondary | ICD-10-CM | POA: Diagnosis not present

## 2023-01-16 DIAGNOSIS — J00 Acute nasopharyngitis [common cold]: Secondary | ICD-10-CM | POA: Diagnosis not present

## 2023-01-16 DIAGNOSIS — J069 Acute upper respiratory infection, unspecified: Secondary | ICD-10-CM | POA: Diagnosis not present

## 2023-01-18 DIAGNOSIS — M25651 Stiffness of right hip, not elsewhere classified: Secondary | ICD-10-CM | POA: Diagnosis not present

## 2023-01-18 DIAGNOSIS — M545 Low back pain, unspecified: Secondary | ICD-10-CM | POA: Diagnosis not present

## 2023-01-18 DIAGNOSIS — M25551 Pain in right hip: Secondary | ICD-10-CM | POA: Diagnosis not present

## 2023-02-14 DIAGNOSIS — Z01419 Encounter for gynecological examination (general) (routine) without abnormal findings: Secondary | ICD-10-CM | POA: Diagnosis not present

## 2023-02-14 DIAGNOSIS — Z6832 Body mass index (BMI) 32.0-32.9, adult: Secondary | ICD-10-CM | POA: Diagnosis not present

## 2023-02-19 DIAGNOSIS — N9089 Other specified noninflammatory disorders of vulva and perineum: Secondary | ICD-10-CM | POA: Diagnosis not present

## 2023-02-19 DIAGNOSIS — L820 Inflamed seborrheic keratosis: Secondary | ICD-10-CM | POA: Diagnosis not present

## 2023-03-06 DIAGNOSIS — N951 Menopausal and female climacteric states: Secondary | ICD-10-CM | POA: Diagnosis not present

## 2023-03-06 DIAGNOSIS — E038 Other specified hypothyroidism: Secondary | ICD-10-CM | POA: Diagnosis not present

## 2023-03-11 DIAGNOSIS — R6882 Decreased libido: Secondary | ICD-10-CM | POA: Diagnosis not present

## 2023-03-11 DIAGNOSIS — Z6832 Body mass index (BMI) 32.0-32.9, adult: Secondary | ICD-10-CM | POA: Diagnosis not present

## 2023-03-11 DIAGNOSIS — N951 Menopausal and female climacteric states: Secondary | ICD-10-CM | POA: Diagnosis not present

## 2023-03-11 DIAGNOSIS — R232 Flushing: Secondary | ICD-10-CM | POA: Diagnosis not present

## 2023-04-04 ENCOUNTER — Other Ambulatory Visit: Payer: BC Managed Care – PPO

## 2023-07-10 DIAGNOSIS — R5382 Chronic fatigue, unspecified: Secondary | ICD-10-CM | POA: Diagnosis not present

## 2023-07-10 DIAGNOSIS — E782 Mixed hyperlipidemia: Secondary | ICD-10-CM | POA: Diagnosis not present

## 2023-07-10 DIAGNOSIS — E559 Vitamin D deficiency, unspecified: Secondary | ICD-10-CM | POA: Diagnosis not present

## 2023-07-10 DIAGNOSIS — Z6832 Body mass index (BMI) 32.0-32.9, adult: Secondary | ICD-10-CM | POA: Diagnosis not present

## 2023-07-10 DIAGNOSIS — N951 Menopausal and female climacteric states: Secondary | ICD-10-CM | POA: Diagnosis not present

## 2023-07-10 DIAGNOSIS — E7800 Pure hypercholesterolemia, unspecified: Secondary | ICD-10-CM | POA: Diagnosis not present

## 2023-07-22 DIAGNOSIS — E559 Vitamin D deficiency, unspecified: Secondary | ICD-10-CM | POA: Diagnosis not present

## 2023-07-22 DIAGNOSIS — R6882 Decreased libido: Secondary | ICD-10-CM | POA: Diagnosis not present

## 2023-07-22 DIAGNOSIS — E782 Mixed hyperlipidemia: Secondary | ICD-10-CM | POA: Diagnosis not present

## 2023-07-22 DIAGNOSIS — N951 Menopausal and female climacteric states: Secondary | ICD-10-CM | POA: Diagnosis not present

## 2023-08-05 DIAGNOSIS — Z6831 Body mass index (BMI) 31.0-31.9, adult: Secondary | ICD-10-CM | POA: Diagnosis not present

## 2023-08-05 DIAGNOSIS — K760 Fatty (change of) liver, not elsewhere classified: Secondary | ICD-10-CM | POA: Diagnosis not present

## 2023-08-19 DIAGNOSIS — K760 Fatty (change of) liver, not elsewhere classified: Secondary | ICD-10-CM | POA: Diagnosis not present

## 2023-08-21 DIAGNOSIS — E039 Hypothyroidism, unspecified: Secondary | ICD-10-CM | POA: Diagnosis not present

## 2023-08-21 DIAGNOSIS — N951 Menopausal and female climacteric states: Secondary | ICD-10-CM | POA: Diagnosis not present

## 2023-08-21 DIAGNOSIS — R5382 Chronic fatigue, unspecified: Secondary | ICD-10-CM | POA: Diagnosis not present

## 2023-08-21 DIAGNOSIS — Z6832 Body mass index (BMI) 32.0-32.9, adult: Secondary | ICD-10-CM | POA: Diagnosis not present

## 2023-09-06 DIAGNOSIS — R5382 Chronic fatigue, unspecified: Secondary | ICD-10-CM | POA: Diagnosis not present

## 2023-09-06 DIAGNOSIS — E782 Mixed hyperlipidemia: Secondary | ICD-10-CM | POA: Diagnosis not present

## 2023-09-06 DIAGNOSIS — Z6831 Body mass index (BMI) 31.0-31.9, adult: Secondary | ICD-10-CM | POA: Diagnosis not present

## 2023-09-10 ENCOUNTER — Ambulatory Visit (INDEPENDENT_AMBULATORY_CARE_PROVIDER_SITE_OTHER)

## 2023-09-10 DIAGNOSIS — S90122A Contusion of left lesser toe(s) without damage to nail, initial encounter: Secondary | ICD-10-CM

## 2023-09-10 DIAGNOSIS — L603 Nail dystrophy: Secondary | ICD-10-CM | POA: Diagnosis not present

## 2023-09-10 DIAGNOSIS — E039 Hypothyroidism, unspecified: Secondary | ICD-10-CM | POA: Insufficient documentation

## 2023-09-11 NOTE — Progress Notes (Signed)
  Subjective:  Patient ID: Jasmin Reese, female    DOB: 1965-05-29,  MRN: [id number] HPI Chief Complaint  Patient presents with   Toe Pain    Hallux right - toenail was loose a few weeks ago and tender at lateral border, but sheets has since caught nail and pulled it off overnight a few nights ago, toe feels better now but still wanted it checked   Toe Injury    2nd toe left - tripped 3 weeks ago and bent toe, felt broken at the time, still swollen and a little tender   New Patient (Initial Visit)    58 y.o. female presents with the above complaint.   ROS: Denies fever chills nausea vomiting muscle aches pains calf pain back pain chest pain shortness of breath.  Past Medical History:  Diagnosis Date   High cholesterol    Past Surgical History:  Procedure Laterality Date   PARTIAL HYSTERECTOMY      Current Outpatient Medications:    PROGESTERONE  PO, Take by mouth., Disp: , Rfl:    Semaglutide-Weight Management (WEGOVY [place]), Inject into the skin., Disp: , Rfl:    Testosterone  100 MG PLLT, See admin instructions., Disp: , Rfl:    Testosterone  37.5 MG PLLT, See admin instructions., Disp: , Rfl:    VITAMIN D-VITAMIN K PO, Take by mouth., Disp: , Rfl:   Allergies  Allergen Reactions   Hydrocodone Itching   Oxycodone-Acetaminophen Itching   Review of Systems Objective:  There were no vitals filed for this visit.  General: Well developed, nourished, in no acute distress, alert and oriented x3   Dermatological: Skin is warm, dry and supple bilateral. Nails x 10 are well maintained; remaining integument appears unremarkable at this time. There are no open sores, no preulcerative lesions, no rash or signs of infection present.  Hallux nail right to fully gated she still has it at home it does appear to be thickened and dystrophic the remaining portion that she has attached.  Cannot really tell without lab evaluation.  Vascular: Dorsalis Pedis artery and Posterior Tibial  artery pedal pulses are 2/4 bilateral with immedate capillary fill time. Pedal hair growth present. No varicosities and no lower extremity edema present bilateral.   Neruologic: Grossly intact via light touch bilateral. Vibratory intact via tuning fork bilateral. Protective threshold with Semmes Wienstein monofilament intact to all pedal sites bilateral. Patellar and Achilles deep tendon reflexes 2+ bilateral. No Babinski or clonus noted bilateral.   Musculoskeletal: No gross boney pedal deformities bilateral. No pain, crepitus, or limitation noted with foot and ankle range of motion bilateral. Muscular strength 5/5 in all groups tested bilateral.  Tenderness and ecchymosis over the second digit of her right foot. Gait: Unassisted, Nonantalgic.    Radiographs:  Radiographs taken do not demonstrate any type of osseous abnormalities to the forefoot.  Assessment & Plan:   Assessment: Contusion second digit right foot.  Nail dystrophy hallux left foot.  Plan: She still has a piece of nail at home she will bring that to us  and we will send it to the lab for sampling.  Follow-up with her in 1 month     Jasmin Reese T. [place], [place]

## 2023-09-12 DIAGNOSIS — L601 Onycholysis: Secondary | ICD-10-CM | POA: Diagnosis not present

## 2023-09-12 DIAGNOSIS — B351 Tinea unguium: Secondary | ICD-10-CM | POA: Diagnosis not present

## 2023-09-12 DIAGNOSIS — L603 Nail dystrophy: Secondary | ICD-10-CM | POA: Diagnosis not present

## 2023-09-16 ENCOUNTER — Other Ambulatory Visit: Payer: Self-pay | Admitting: Podiatry

## 2023-09-16 DIAGNOSIS — L603 Nail dystrophy: Secondary | ICD-10-CM

## 2023-09-18 DIAGNOSIS — R21 Rash and other nonspecific skin eruption: Secondary | ICD-10-CM | POA: Diagnosis not present

## 2023-09-18 DIAGNOSIS — L820 Inflamed seborrheic keratosis: Secondary | ICD-10-CM | POA: Diagnosis not present

## 2023-09-26 ENCOUNTER — Other Ambulatory Visit: Payer: Self-pay | Admitting: Podiatry

## 2023-10-03 ENCOUNTER — Ambulatory Visit: Payer: Self-pay | Admitting: Podiatry

## 2023-10-07 NOTE — Telephone Encounter (Signed)
 Pts original appt on 7/1 has been rescheduled for 7/22 with Dr. Verta. Pt states since she has seen the results testing positive for fungus, can the medication for it can go ahead and be sent? Pts preferred pharmacy is [facility] in [place] located on [address]. Thanks

## 2023-10-15 ENCOUNTER — Ambulatory Visit: Admitting: Podiatry

## 2023-10-15 DIAGNOSIS — L255 Unspecified contact dermatitis due to plants, except food: Secondary | ICD-10-CM | POA: Diagnosis not present

## 2023-11-05 ENCOUNTER — Ambulatory Visit: Admitting: Podiatry

## 2023-11-08 DIAGNOSIS — E039 Hypothyroidism, unspecified: Secondary | ICD-10-CM | POA: Diagnosis not present

## 2023-11-08 DIAGNOSIS — N951 Menopausal and female climacteric states: Secondary | ICD-10-CM | POA: Diagnosis not present

## 2023-11-12 ENCOUNTER — Ambulatory Visit (INDEPENDENT_AMBULATORY_CARE_PROVIDER_SITE_OTHER): Admitting: Podiatry

## 2023-11-12 ENCOUNTER — Encounter: Payer: Self-pay | Admitting: Podiatry

## 2023-11-12 DIAGNOSIS — R6882 Decreased libido: Secondary | ICD-10-CM | POA: Diagnosis not present

## 2023-11-12 DIAGNOSIS — K760 Fatty (change of) liver, not elsewhere classified: Secondary | ICD-10-CM | POA: Insufficient documentation

## 2023-11-12 DIAGNOSIS — R5382 Chronic fatigue, unspecified: Secondary | ICD-10-CM | POA: Insufficient documentation

## 2023-11-12 DIAGNOSIS — L603 Nail dystrophy: Secondary | ICD-10-CM

## 2023-11-12 DIAGNOSIS — E559 Vitamin D deficiency, unspecified: Secondary | ICD-10-CM | POA: Insufficient documentation

## 2023-11-12 DIAGNOSIS — S90852A Superficial foreign body, left foot, initial encounter: Secondary | ICD-10-CM

## 2023-11-12 DIAGNOSIS — E782 Mixed hyperlipidemia: Secondary | ICD-10-CM | POA: Insufficient documentation

## 2023-11-12 DIAGNOSIS — N951 Menopausal and female climacteric states: Secondary | ICD-10-CM | POA: Diagnosis not present

## 2023-11-12 DIAGNOSIS — R0683 Snoring: Secondary | ICD-10-CM | POA: Insufficient documentation

## 2023-11-12 DIAGNOSIS — Z6831 Body mass index (BMI) 31.0-31.9, adult: Secondary | ICD-10-CM | POA: Diagnosis not present

## 2023-11-12 DIAGNOSIS — E66811 Obesity, class 1: Secondary | ICD-10-CM | POA: Insufficient documentation

## 2023-11-12 DIAGNOSIS — G4700 Insomnia, unspecified: Secondary | ICD-10-CM | POA: Insufficient documentation

## 2023-11-12 MED ORDER — TERBINAFINE HCL 250 MG PO TABS: 250.0000 mg | ORAL_TABLET | Freq: Every day | ORAL | 0 refills | Status: AC

## 2023-11-12 NOTE — Progress Notes (Signed)
 She presents today for follow-up of her nail pathology results.  She is also concerned about a piece of glass that she may have in her heel from a few weeks ago when she dropped a wine glass at her house.  Objective: Vital signs are stable alert oriented x 3 pulses are palpable.  No change in physical exam of her nails.  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  The left heel in the posterior lateral aspect does demonstrate a small bloody area with thickened skin once debrided does demonstrate a very small piece of glass measuring about a a 0.1 cm in diameter shard of glass.  Which was removed.  No purulence no malodor no signs of infection.  Assessment: Septic headache, numbness and foreign body.  Plan: Discussed oral therapy topical therapy laser therapy as far as the nails grow.  At this point we will get her started on oral therapy.  So organ to start oral therapy Lamisil  tablets 250 mg tablet.  She will follow-up with me in 1 month she will take 1 tablet daily until that time.  At that time we will perform a comprehensive metabolic panel.  After Betadine skin prep I was able to debride the shard of glass today.  Placed antibiotic ointment and a Band-Aid.

## 2023-12-05 DIAGNOSIS — Z6830 Body mass index (BMI) 30.0-30.9, adult: Secondary | ICD-10-CM | POA: Diagnosis not present

## 2023-12-05 DIAGNOSIS — E782 Mixed hyperlipidemia: Secondary | ICD-10-CM | POA: Diagnosis not present

## 2023-12-10 ENCOUNTER — Other Ambulatory Visit: Payer: Self-pay | Admitting: Podiatry

## 2024-01-21 DIAGNOSIS — H25042 Posterior subcapsular polar age-related cataract, left eye: Secondary | ICD-10-CM | POA: Diagnosis not present

## 2024-01-21 DIAGNOSIS — H43393 Other vitreous opacities, bilateral: Secondary | ICD-10-CM | POA: Diagnosis not present

## 2024-03-31 DIAGNOSIS — E039 Hypothyroidism, unspecified: Secondary | ICD-10-CM | POA: Diagnosis not present

## 2024-03-31 DIAGNOSIS — N951 Menopausal and female climacteric states: Secondary | ICD-10-CM | POA: Diagnosis not present

## 2024-04-07 DIAGNOSIS — N951 Menopausal and female climacteric states: Secondary | ICD-10-CM | POA: Diagnosis not present
